# Patient Record
Sex: Male | Born: 2000 | Hispanic: Yes | Marital: Single | Smoking: Never smoker
Health system: Southern US, Community
[De-identification: ages and names within clinical notes are randomized; demographics above are authoritative.]

## PROBLEM LIST (undated history)

## (undated) DIAGNOSIS — K5900 Constipation, unspecified: Secondary | ICD-10-CM

---
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
## 2022-10-11 ENCOUNTER — Encounter (HOSPITAL_COMMUNITY): Payer: Self-pay

## 2022-10-11 ENCOUNTER — Ambulatory Visit (HOSPITAL_COMMUNITY)
Admission: RE | Admit: 2022-10-11 | Discharge: 2022-10-11 | Disposition: A | Discharge status: Home / Self Care | Payer: Medicaid Other | Source: Ambulatory Visit | Attending: Family Medicine | Admitting: Family Medicine

## 2022-10-11 ENCOUNTER — Ambulatory Visit (INDEPENDENT_AMBULATORY_CARE_PROVIDER_SITE_OTHER): Payer: Medicaid Other

## 2022-10-11 VITALS — BP 109/63 | HR 62 | Temp 97.8°F | Resp 14

## 2022-10-11 DIAGNOSIS — R140 Abdominal distension (gaseous): Secondary | ICD-10-CM | POA: Diagnosis not present

## 2022-10-11 DIAGNOSIS — K5900 Constipation, unspecified: Secondary | ICD-10-CM

## 2022-10-11 HISTORY — DX: Constipation, unspecified: K59.00

## 2022-10-11 MED ORDER — HYOSCYAMINE SULFATE 0.125 MG PO TABS: 0.1250 mg | ORAL_TABLET | Freq: Four times a day (QID) | ORAL | 0 refills | Status: AC | PRN

## 2022-10-11 NOTE — Discharge Instructions (Addendum)
You were seen today for bloating.  Your xray was normal today, not showing any constipation.  I have sent out medication to see if helps with bloating.  Only take this for several days as it may cause constipation.  If you continue with symptoms then please make an appointment to see a gastroenterologist for your chronic issues.

## 2022-10-11 NOTE — ED Provider Notes (Signed)
[place]    CSN: [id number] Arrival date & time: 10/11/22  0945      History   Chief Complaint Chief Complaint  Patient presents with   Bloated    HPI Gregory Walters is a 22 y.o. male.   Patient is here for abdominal bloating x 1 week.  At times he feels some pressure at the left lower abdomen.  Before the bloating he was having constipation issues, blood mixed with stool.  He did take a laxative and that did help.  But now just having bloating.  He is eating/drinking okay.  He has tried to change his diet.  He did have a small BM today.  No fevers/chills.   No further blood in the stool.  No n/v.  He actually has had issues with constipation off/on the last several years.  He last noted blood about 3-4 months ago.  He last took a laxative about 3-4 days ago.  He took 3-4 gas-x yesterday without help      Past Medical History:  Diagnosis Date   Constipation     There are no problems to display for this patient.   History reviewed. No pertinent surgical history.     Home Medications    Prior to Admission medications   Medication Sig Start Date End Date Taking? Authorizing Provider  polyethylene glycol (MIRALAX) packet Take 17 g by mouth daily. 04/09/18   Mabe, Forbes Cellar, MD    Family History Family History  Family history unknown: Yes    Social History Social History   Tobacco Use   Smoking status: Never   Smokeless tobacco: Never  Vaping Use   Vaping Use: Never used  Substance Use Topics   Alcohol use: Yes   Drug use: Never     Allergies   Patient has no known allergies.   Review of Systems Review of Systems  Constitutional: Negative.   HENT: Negative.    Respiratory: Negative.    Cardiovascular: Negative.   Gastrointestinal:  Positive for abdominal pain, blood in stool and constipation. Negative for diarrhea, nausea and vomiting.  Skin: Negative.   Psychiatric/Behavioral: Negative.       Physical Exam Triage Vital Signs ED  Triage Vitals  Enc Vitals Group     BP 10/11/22 1012 109/63     Pulse Rate 10/11/22 1012 62     Resp 10/11/22 1012 14     Temp 10/11/22 1012 97.8 F (36.6 C)     Temp Source 10/11/22 1012 Oral     SpO2 10/11/22 1012 98 %     Weight --      Height --      Head Circumference --      Peak Flow --      Pain Score 10/11/22 1015 6     Pain Loc --      Pain Edu? --      Excl. in [place]? --    No data found.  Updated Vital Signs BP 109/63 (BP Location: Left Arm)   Pulse 62   Temp 97.8 F (36.6 C) (Oral)   Resp 14   SpO2 98%   Visual Acuity Right Eye Distance:   Left Eye Distance:   Bilateral Distance:    Right Eye Near:   Left Eye Near:    Bilateral Near:     Physical Exam Constitutional:      Appearance: Normal appearance.  Cardiovascular:     Rate and Rhythm: Normal rate and regular  rhythm.  Pulmonary:     Effort: Pulmonary effort is normal.     Breath sounds: Normal breath sounds.  Abdominal:     General: Bowel sounds are normal. There is no distension.     Palpations: Abdomen is soft.     Tenderness: There is no abdominal tenderness. There is no guarding or rebound.  Musculoskeletal:     Cervical back: Normal range of motion.  Skin:    General: Skin is warm.  Neurological:     General: No focal deficit present.     Mental Status: He is alert.  Psychiatric:        Mood and Affect: Mood normal.      UC Treatments / Results  Labs (all labs ordered are listed, but only abnormal results are displayed) Labs Reviewed - No data to display  EKG   Radiology DG Abd 1 View  Result Date: 10/11/2022 CLINICAL DATA:  Constipation with bloating EXAM: ABDOMEN - 1 VIEW COMPARISON:  None Available. FINDINGS: The bowel gas pattern is normal. No abnormal stool retention. No radio-opaque calculi or other significant radiographic abnormality are seen. IMPRESSION: Normal exam. Electronically Signed   By: Jorje Guild M.D.   On: 10/11/2022 10:52    Procedures Procedures  (including critical care time)  Medications Ordered in UC Medications - No data to display  Initial Impression / Assessment and Plan / UC Course  I have reviewed the triage vital signs and the nursing notes.  Pertinent labs & imaging results that were available during my care of the patient were reviewed by me and considered in my medical decision making (see chart for details).   Final Clinical Impressions(s) / UC Diagnoses   Final diagnoses:  Constipation, unspecified constipation type  Bloating     Discharge Instructions      You were seen today for bloating.  Your xray was normal today, not showing any constipation.  I have sent out medication to see if helps with bloating.  Only take this for several days as it may cause constipation.  If you continue with symptoms then please make an appointment to see a gastroenterologist for your chronic issues.     ED Prescriptions     Medication Sig Dispense Auth. Provider   hyoscyamine (LEVSIN) 0.125 MG tablet Take 1 tablet (0.125 mg total) by mouth every 6 (six) hours as needed. 10 tablet Rondel Oh, MD      PDMP not reviewed this encounter.   Rondel Oh, MD 10/11/22 [phone]

## 2022-10-11 NOTE — ED Triage Notes (Signed)
Patient reports that he has had abdominal bloating x 1 week. Patient states he had his last BM today an was normal, but a smaller amount. Patient states he has bloating and pain in the left lower abdomen, especially after eating.  Patient states he has taken OTC "gas medicine" and a laxative 3 days ago.
